# Patient Record
Sex: Female | Born: 1980 | Race: White | Hispanic: No | Marital: Married | Smoking: Never smoker
Health system: Southern US, Community
[De-identification: ages and names within clinical notes are randomized; demographics above are authoritative.]

## PROBLEM LIST (undated history)

## (undated) DIAGNOSIS — R51 Headache: Secondary | ICD-10-CM

## (undated) DIAGNOSIS — O1423 HELLP syndrome (HELLP), third trimester: Secondary | ICD-10-CM

## (undated) DIAGNOSIS — K219 Gastro-esophageal reflux disease without esophagitis: Secondary | ICD-10-CM

## (undated) DIAGNOSIS — R519 Headache, unspecified: Secondary | ICD-10-CM

## (undated) DIAGNOSIS — I10 Essential (primary) hypertension: Secondary | ICD-10-CM

## (undated) HISTORY — PX: WISDOM TOOTH EXTRACTION: SHX21

---

## 1999-05-12 ENCOUNTER — Emergency Department (HOSPITAL_COMMUNITY): Admission: EM | Admit: 1999-05-12 | Discharge: 1999-05-12 | Payer: Self-pay | Admitting: Emergency Medicine

## 2003-02-22 ENCOUNTER — Other Ambulatory Visit: Admission: RE | Admit: 2003-02-22 | Discharge: 2003-02-22 | Payer: Self-pay | Admitting: Obstetrics and Gynecology

## 2004-04-06 ENCOUNTER — Other Ambulatory Visit: Admission: RE | Admit: 2004-04-06 | Discharge: 2004-04-06 | Payer: Self-pay | Admitting: Obstetrics and Gynecology

## 2004-06-30 ENCOUNTER — Inpatient Hospital Stay (HOSPITAL_COMMUNITY): Admission: AD | Admit: 2004-06-30 | Discharge: 2004-06-30 | Payer: Self-pay | Admitting: Obstetrics and Gynecology

## 2004-09-02 ENCOUNTER — Inpatient Hospital Stay (HOSPITAL_COMMUNITY): Admission: AD | Admit: 2004-09-02 | Discharge: 2004-09-07 | Payer: Self-pay | Admitting: Obstetrics and Gynecology

## 2004-09-02 ENCOUNTER — Encounter (INDEPENDENT_AMBULATORY_CARE_PROVIDER_SITE_OTHER): Payer: Self-pay | Admitting: Specialist

## 2004-09-08 ENCOUNTER — Encounter: Admission: RE | Admit: 2004-09-08 | Discharge: 2004-10-08 | Payer: Self-pay | Admitting: Obstetrics and Gynecology

## 2004-10-09 ENCOUNTER — Encounter: Admission: RE | Admit: 2004-10-09 | Discharge: 2004-11-08 | Payer: Self-pay | Admitting: Obstetrics and Gynecology

## 2004-10-19 ENCOUNTER — Other Ambulatory Visit: Admission: RE | Admit: 2004-10-19 | Discharge: 2004-10-19 | Payer: Self-pay | Admitting: Obstetrics and Gynecology

## 2004-10-24 ENCOUNTER — Ambulatory Visit: Payer: Self-pay | Admitting: Oncology

## 2004-12-14 ENCOUNTER — Ambulatory Visit: Payer: Self-pay | Admitting: Oncology

## 2005-03-20 ENCOUNTER — Ambulatory Visit: Payer: Self-pay | Admitting: Oncology

## 2009-01-03 ENCOUNTER — Emergency Department (HOSPITAL_BASED_OUTPATIENT_CLINIC_OR_DEPARTMENT_OTHER): Admission: EM | Admit: 2009-01-03 | Discharge: 2009-01-03 | Payer: Self-pay | Admitting: Emergency Medicine

## 2010-08-04 LAB — CBC
HCT: 42.1 % (ref 36.0–46.0)
MCHC: 33.4 g/dL (ref 30.0–36.0)
MCV: 91.6 fL (ref 78.0–100.0)
RDW: 11.4 % — ABNORMAL LOW (ref 11.5–15.5)
WBC: 5.3 10*3/uL (ref 4.0–10.5)

## 2010-08-04 LAB — DIFFERENTIAL
Basophils Relative: 0 % (ref 0–1)
Eosinophils Absolute: 0 10*3/uL (ref 0.0–0.7)
Lymphocytes Relative: 27 % (ref 12–46)
Lymphs Abs: 1.4 10*3/uL (ref 0.7–4.0)
Monocytes Absolute: 0.5 10*3/uL (ref 0.1–1.0)
Neutro Abs: 3.4 10*3/uL (ref 1.7–7.7)

## 2010-08-04 LAB — URINALYSIS, ROUTINE W REFLEX MICROSCOPIC
Bilirubin Urine: NEGATIVE
Hgb urine dipstick: NEGATIVE
Ketones, ur: 15 mg/dL — AB
Nitrite: NEGATIVE
Specific Gravity, Urine: 1.006 (ref 1.005–1.030)
Urobilinogen, UA: 0.2 mg/dL (ref 0.0–1.0)

## 2010-08-04 LAB — PREGNANCY, URINE: Preg Test, Ur: NEGATIVE

## 2010-08-04 LAB — COMPREHENSIVE METABOLIC PANEL
Creatinine, Ser: 0.7 mg/dL (ref 0.4–1.2)
GFR calc non Af Amer: >60 mL/min (ref >60)
Glucose, Bld: 93 mg/dL (ref 70–99)
Total Bilirubin: 0.5 mg/dL (ref 0.3–1.2)

## 2010-09-15 NOTE — Op Note (Signed)
[name], [name]              ACCOUNT NO.:  [id number]   MEDICAL RECORD NO.:  [id number]          PATIENT TYPE:  INP   LOCATION:  9198                          FACILITY:  [facility]   PHYSICIAN:  Juluis Mire, M.D.   DATE OF BIRTH:  Feb 20, 1981   DATE OF PROCEDURE:  09/02/2004  DATE OF DISCHARGE:                                 OPERATIVE REPORT   PREOPERATIVE DIAGNOSES:  1.  Intrauterine pregnancy at 32 weeks.  2.  Severe pregnancy-induced hypertension.   POSTOPERATIVE DIAGNOSES:  1.  Intrauterine pregnancy at 32 weeks.  2.  Severe pregnancy-induced hypertension.   OPERATION:  Primary low transverse cesarean section.   SURGEON:  Juluis Mire, M.D.   ANESTHESIA:  General.   ESTIMATED BLOOD LOSS:  400 mL.   PACKS AND DRAINS:  None.   INTRAOPERATIVE BLOOD REPLACEMENT:  None.   COMPLICATIONS:  None.   INDICATIONS:  Dictated in History and Physical.   PROCEDURE IN DETAIL:  The patient was taken to the OR and placed in supine  position with left lateral tilt.  The abdomen was prepped with Betadine,  draped as a sterile field.  After satisfactory level of general endotracheal  anesthesia obtained, a low-transverse skin incision was made with the knife  and carried through to the subcutaneous tissue.  The fascia was entered  sharply and incision to the fascia extended laterally.  The fascia was taken  off the muscles superiorly and inferiorly.  The rectus muscles were  separated in the midline.  The peritoneum was entered and the incision in  the peritoneum extended both superiorly and inferiorly.  A low-transverse  bladder flap was developed.  The low transverse uterine incision begun with  the knife and extended using manual traction.  Amniotic fluid was clear.  The infant was delivered with elevation of head and fundal pressure.  There  was a nuchal cord x2.  The infant was a viable female.  Apgars were 5/8.  Umbilical pH was 7.32.  The placenta was then delivered manually  and sent  for pathologic review.  The uterus was exteriorized for closure.  Tubes and  ovaries were unremarkable.  The uterus was closed with a running locking  suture of 0 Chromic using two-layer closure technique.  We got good  hemostasis and clear urine output.  The pelvic cavity was thoroughly  irrigated.  Hemostasis again excellent.  Uterus turned back to the abdominal  cavity.  Muscles and peritoneum closed with running suture of 3-0 Vicryl.  The fascia closed with running suture of 0 PDS.  Subcutaneous closed with  running suture of 3-0 Vicryl.  The skin was closed  with staples and Steri-Strips.  Sponge, instrument, needle count reported as  correct by circulating nurse x2.  Foley catheter remained clear at the time  of closure.  The patient tolerated the procedure well and was returned to  recovery room in good condition.      JSM/MEDQ  D:  09/02/2004  T:  09/02/2004  Job:  [id number]

## 2010-09-15 NOTE — H&P (Signed)
NAMEZAHNIYA, [name] NO.:  [id number]   MEDICAL RECORD NO.:  [id number]           PATIENT TYPE:   LOCATION:                               FACILITY:  [facility]   PHYSICIAN:  Juluis Mire, M.D.   DATE OF BIRTH:  04/10/1948   DATE OF ADMISSION:  09/02/2004  DATE OF DISCHARGE:                                HISTORY & PHYSICAL   HISTORY OF PRESENT ILLNESS:  The patient is a 30 year old prima gravida  married white female, last menstrual period of January 25, 2004, giving  her an estimated date of confinement October 31, 2004, given her an estimated  gestational age of 31-1/2 weeks, consistent with the initial evaluation  ultrasound.   Her prenatal course had been complicated by some issues with preterm uterine  activity.  She presented to the triage area complaining of increasing back,  shoulder discomfort and abdominal pain.  Initial monitoring did reveal  uterine activity that resolved with Terbutaline.  Blood pressure was 132/90  on initial evaluation.  Other vital signs were stable.  She complained of  significant upper back pain, radiating to the right arm and the upper  abdomen.  An arterial blood gas was obtained to rule out a pulmonary  embolus. This was negative.  Subsequently, PIH panel was obtained which  revealed marked abnormalities.  Her LDH was 263.  Her SGOT was 107, SGPT was  119 and her platelet count was 74,000.  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  It was noted that her cervix was long and closed and in view of  the laboratories, we are going to proceed with primary cesarean section.   ALLERGIES:  In terms of allergies, NO KNOWN DRUG ALLERGIES.   MEDICATIONS:  Prenatal vitamins.   PAST MEDICAL HISTORY:  For past medical history, family history, and social  history, please see prenatal records.   REVIEW OF SYSTEMS:  Noncontributory except for the above-noted  complaints.   PHYSICAL EXAMINATION:  VITAL SIGNS:  The patient's blood pressure is 132/90.  Other vital signs are stable.  LUNGS:  Clear.  CARDIAC:  A regular rate and rhythm with grade 2/6 systolic ejection murmur.  No clicks or gallops.  ABDOMEN:  Gravid uterus consistent with dates.  There is some epigastric  tenderness.  Bowel sounds are active.  Estimated fetal weight would probably  be approximately a 4 to 4.5 pounds  She does have marked upper back pain.  No tenderness or CVA tenderness is noted.  PELVIC:  Cervix is extremely long and closed.  Deep tendon reflexes revealed  3+ deep tendon reflexes with no clonus.  She does have 1+ pitting edema.   A biophysical profile had been obtained.  We had a value of 6/8 with no  respirations.  Amniotic fluid was normal.  Cervical length was 4.2 cm.  It  is also noted that we evaluated the renal status which was unremarkable in  terms of the patient.   Fetal heart rate tracing is reactive with  no decelerations and again,  laboratories revealed a platelet count of 74,000, hemoglobin was 12.1.  Her  SGOT was 207, SGPT was 119, LDH was 363 and her uric acid was 5.   IMPRESSION:  1. Intra-uterine pregnancy at 31-1/2 weeks with severe toxemia in the form      of HELP syndrome.  2. Back discomfort probably secondary to liver congestion.     PLAN:  The patient will undergo primary cesarean section.  The risks have  been discussed including the risk of anesthesia.  The risk of infection.  The risk of hemorrhage that could require transfusion with the risk of AIDS  or hepatitis.  The risk of injury to adjacent organs including bladder,  bowel or ureters that could require further exploratory surgery.  The risk  of deep venous thrombosis and pulmonary embolus.  In view of low platelets,  there is a risk of incisional ecchymosis and other bleeding complications,  need for platelet transfusions have been discussed.  We have discussed the   nature of the HELP syndrome and its implications.  We have discussed the  need for early delivery.  The risk of prematurity of the infant in terms of  immature lungs have been discussed, its management in the intensive care  nursery have also been explained.  Finally, we have explained postoperative  management for the patient that will include magnesium sulfate and  monitoring in the adult intensive care unit.  There may be the again need  for platelet transfusions or other management if worsening toxemia.      JSM/MEDQ  D:  09/02/2004  T:  09/02/2004  Job:  [id number]

## 2010-09-15 NOTE — Discharge Summary (Signed)
NAMECYNITHIA, [name]              ACCOUNT NO.:  [id number]   MEDICAL RECORD NO.:  [id number]          PATIENT TYPE:  INP   LOCATION:  9310                          FACILITY:  [facility]   PHYSICIAN:  Michelle L. Grewal, M.D.DATE OF BIRTH:  06/02/1980   DATE OF ADMISSION:  09/02/2004  DATE OF DISCHARGE:  09/07/2004                                 DISCHARGE SUMMARY   ADMITTING DIAGNOSES:  1.  Intrauterine pregnancy at 31 weeks estimated gestational age.  2.  Severe pregnancy-induced hypertension with HELLP syndrome.   DISCHARGE DIAGNOSES:  1.  Status post low transverse cesarean section.  2.  Viable female infant.   PROCEDURE:  Primary low transverse cesarean section.   REASON FOR ADMISSION:  Please see dictated H&P.   HOSPITAL COURSE:  The patient is a 30 year old, white married female,  primigravida who was [facility] to [facility] at 31 weeks  estimated gestational age. The patient's prenatal course had been  complicated by some preterm uterine activity. The patient had presented to  triage area complaining of back and shoulder discomfort with abdominal pain.  Initial fetal monitoring did reveal some uterine contractions which did  resolve initially with terbutaline. Blood pressure on admission was 132/90.  Other vital signs were stable. The patient complained of significant pain  which radiated into her right arm and upper abdomen. Arterial cord blood gas  was obtained to rule out a pulmonary embolus. Results were negative.  Subsequently a PIH panel was obtained which revealed marked abnormalities.  LDH was 263, SGOT was 107, SGPT was 119 and platelet count was 74,000. The  diagnosis of severe toxemia was made and a decision was made to proceed with  cesarean delivery. Biophysical profile also had been obtained with a value  of 6/8 without respiration. Amniotic fluid index was normal. Cervical length  was noted to be 4.2 cm. In view of the laboratory findings, a  decision was  made to proceed with a primary low transverse cesarean section. The patient  was then taken to the operating room where spinal anesthesia was  administered without difficulty. A low transverse incision was made with  delivery of a viable female infant weighing 3 pounds 11 ounces with Apgar's of  5 at 1 minute and  8 at 5 minutes. Arterial cord pH was 7.32. The patient  tolerated the procedure well and was taken to the recovery room in stable  condition. The patient was then transferred to the AICU where she was placed  on magnesium sulfate. On the following morning, the patient was without  complaint. Vital signs were stable with blood pressure 120/80, abdomen soft  with good return of bowel function. Fundus was firm and nontender. The  patient had good urine output. Liver function tests were improving, platelet  count was 62,000. The patient continued on magnesium sulfate. On  postoperative day #2, the patient was without headache, blurred vision or  right upper quadrant pain. Vital signs remained stable. She was afebrile.  Blood pressure was 130-140's/70- 80's. Deep tendon reflexes were 2+ without  clonus. Abdomen was soft, fundus was firm and nontender.  Abdominal dressing  had been removed revealing an incision that is clean, dry, and intact. Urine  output was noted to be 100-150 mL/hour; however, she was still in the  deficit of intake over the last 24 hours of 1720 and output 1030.   Laboratory findings revealed s hemoglobin of 9.6, platelet count was 83,000  which was up from 62,000 on the previous day. AST was 30, ALT was 71 and LDH  was 267. Magnesium level was 6.2.  The patient was given Lasix 20 mg IV and  PIH labs were drawn later in the evening. On the following day, the patient  was without complaint, magnesium had been discontinued on the previous  evening. Vital signs were stable, blood pressure was 122/88 with good urine  output.   Laboratory findings revealed  hemoglobin of 10.4, platelet count was up to  101,000. SGOT was 35, SGPT was 65. The patient was thought to be improving  and was later transferred to the mother baby unit that evening. On  postoperative day #4, the patient did complain of a headache, blood pressure  was 119/75, other vital signs were stable. Abdomen soft, fundus was firm and  nontender. Incision was clean, dry and intact. PIH labs revealed an AST of  45 and ALT of 63. The decision was to observe the patient an additional day.  On postoperative day #5, the patient was without complaint, headache and  resolved, vital signs remained stable. She was afebrile. Blood pressure was  normalized, abdomen soft. Incision was clean, dry and intact. Staples  removed and the patient was now discharge home.   CONDITION ON DISCHARGE:  Stable.   DIET:  Regular as tolerated.   ACTIVITY:  No heavy lifting, no driving x2 weeks, no vaginal entry.   FOLLOW-UP:  Patient to follow up in the office in one week for an incision  check. She is to call for a temperature greater than 100 degrees, persistent  nausea and vomiting, heavy vaginal bleeding and/or redness or drainage from  the incisional site. The patient was also instructed to call for headache,  blurred vision or right upper quadrant pain or jitteriness.   DISCHARGE MEDICATIONS:  1.  Tylox #30, 1 p.o. every 4-6h p.r.n.  2.  Motrin 600 mg every 6 hours.  3.  Prenatal vitamins one p.o. daily.  4.  Colace one p.o. daily p.r.n.      CC/MEDQ  D:  09/27/2004  T:  09/27/2004  Job:  [id number]

## 2012-04-08 ENCOUNTER — Other Ambulatory Visit: Payer: Self-pay | Admitting: Obstetrics and Gynecology

## 2014-08-18 ENCOUNTER — Other Ambulatory Visit: Payer: Self-pay | Admitting: Obstetrics and Gynecology

## 2014-08-23 LAB — CYTOLOGY - PAP

## 2014-11-18 LAB — OB RESULTS CONSOLE RUBELLA ANTIBODY, IGM: Rubella: NON-IMMUNE/NOT IMMUNE

## 2015-06-07 ENCOUNTER — Encounter (HOSPITAL_COMMUNITY): Payer: Self-pay | Admitting: *Deleted

## 2015-06-07 ENCOUNTER — Encounter (HOSPITAL_COMMUNITY): Payer: Self-pay

## 2015-06-07 ENCOUNTER — Inpatient Hospital Stay (HOSPITAL_COMMUNITY)
Admission: AD | Admit: 2015-06-07 | Discharge: 2015-06-07 | Disposition: A | Discharge status: Home / Self Care | Payer: BLUE CROSS/BLUE SHIELD | Source: Ambulatory Visit | Attending: Obstetrics & Gynecology | Admitting: Obstetrics & Gynecology

## 2015-06-07 ENCOUNTER — Inpatient Hospital Stay (HOSPITAL_COMMUNITY)
Admission: AD | Admit: 2015-06-07 | Discharge: 2015-06-10 | DRG: 765 | Disposition: A | Discharge status: Home / Self Care | Payer: BLUE CROSS/BLUE SHIELD | Source: Ambulatory Visit | Attending: Obstetrics and Gynecology | Admitting: Obstetrics and Gynecology

## 2015-06-07 DIAGNOSIS — Z98891 History of uterine scar from previous surgery: Secondary | ICD-10-CM

## 2015-06-07 DIAGNOSIS — O34211 Maternal care for low transverse scar from previous cesarean delivery: Principal | ICD-10-CM | POA: Diagnosis present

## 2015-06-07 DIAGNOSIS — O9962 Diseases of the digestive system complicating childbirth: Secondary | ICD-10-CM | POA: Diagnosis present

## 2015-06-07 DIAGNOSIS — Z3A37 37 weeks gestation of pregnancy: Secondary | ICD-10-CM

## 2015-06-07 DIAGNOSIS — K219 Gastro-esophageal reflux disease without esophagitis: Secondary | ICD-10-CM | POA: Diagnosis present

## 2015-06-07 DIAGNOSIS — Z302 Encounter for sterilization: Secondary | ICD-10-CM

## 2015-06-07 DIAGNOSIS — O1092 Unspecified pre-existing hypertension complicating childbirth: Secondary | ICD-10-CM | POA: Diagnosis present

## 2015-06-07 DIAGNOSIS — O4292 Full-term premature rupture of membranes, unspecified as to length of time between rupture and onset of labor: Secondary | ICD-10-CM | POA: Diagnosis present

## 2015-06-07 HISTORY — DX: Gastro-esophageal reflux disease without esophagitis: K21.9

## 2015-06-07 HISTORY — DX: Headache: R51

## 2015-06-07 HISTORY — DX: HELLP syndrome (HELLP), third trimester: O14.23

## 2015-06-07 HISTORY — DX: Headache, unspecified: R51.9

## 2015-06-07 HISTORY — DX: Essential (primary) hypertension: I10

## 2015-06-07 LAB — POCT FERN TEST: POCT FERN TEST: POSITIVE

## 2015-06-07 NOTE — Discharge Instructions (Signed)
Braxton Hicks Contractions °Contractions of the uterus can occur throughout pregnancy. Contractions are not always a sign that you are in labor.  °WHAT ARE BRAXTON HICKS CONTRACTIONS?  °Contractions that occur before labor are called Braxton Hicks contractions, or false labor. Toward the end of pregnancy (32-34 weeks), these contractions can develop more often and may become more forceful. This is not true labor because these contractions do not result in opening (dilatation) and thinning of the cervix. They are sometimes difficult to tell apart from true labor because these contractions can be forceful and people have different pain tolerances. You should not feel embarrassed if you go to the hospital with false labor. Sometimes, the only way to tell if you are in true labor is for your health care provider to look for changes in the cervix. °If there are no prenatal problems or other health problems associated with the pregnancy, it is completely safe to be sent home with false labor and await the onset of true labor. °HOW CAN YOU TELL THE DIFFERENCE BETWEEN TRUE AND FALSE LABOR? °False Labor °· The contractions of false labor are usually shorter and not as hard as those of true labor.   °· The contractions are usually irregular.   °· The contractions are often felt in the front of the lower abdomen and in the groin.   °· The contractions may go away when you walk around or change positions while lying down.   °· The contractions get weaker and are shorter lasting as time goes on.   °· The contractions do not usually become progressively stronger, regular, and closer together as with true labor.   °True Labor °· Contractions in true labor last 30-70 seconds, become very regular, usually become more intense, and increase in frequency.   °· The contractions do not go away with walking.   °· The discomfort is usually felt in the top of the uterus and spreads to the lower abdomen and low back.   °· True labor can be  determined by your health care provider with an exam. This will show that the cervix is dilating and getting thinner.   °WHAT TO REMEMBER °· Keep up with your usual exercises and follow other instructions given by your health care provider.   °· Take medicines as directed by your health care provider.   °· Keep your regular prenatal appointments.   °· Eat and drink lightly if you think you are going into labor.   °· If Braxton Hicks contractions are making you uncomfortable:   °¨ Change your position from lying down or resting to walking, or from walking to resting.   °¨ Sit and rest in a tub of warm water.   °¨ Drink 2-3 glasses of water. Dehydration may cause these contractions.   °¨ Do slow and deep breathing several times an hour.   °WHEN SHOULD I SEEK IMMEDIATE MEDICAL CARE? °Seek immediate medical care if: °· Your contractions become stronger, more regular, and closer together.   °· You have fluid leaking or gushing from your vagina.   °· You have a fever.   °· You pass blood-tinged mucus.   °· You have vaginal bleeding.   °· You have continuous abdominal pain.   °· You have low back pain that you never had before.   °· You feel your baby's head pushing down and causing pelvic pressure.   °· Your baby is not moving as much as it used to.   °  °This information is not intended to replace advice given to you by your health care provider. Make sure you discuss any questions you have with your health care   provider. °  °Document Released: 04/16/2005 Document Revised: 04/21/2013 Document Reviewed: 01/26/2013 °Elsevier Interactive Patient Education ©2016 [employer] ° °

## 2015-06-07 NOTE — MAU Note (Signed)
Pt c/o contractions every 5-10 mins all day. Denies LOF or vag bleeding. +FM Cervix was closed Friday.

## 2015-06-08 ENCOUNTER — Inpatient Hospital Stay (HOSPITAL_COMMUNITY): Payer: BLUE CROSS/BLUE SHIELD | Admitting: Anesthesiology

## 2015-06-08 ENCOUNTER — Encounter (HOSPITAL_COMMUNITY)
Admission: AD | Disposition: A | Discharge status: Home / Self Care | Payer: Self-pay | Source: Ambulatory Visit | Attending: Obstetrics and Gynecology

## 2015-06-08 ENCOUNTER — Encounter (HOSPITAL_COMMUNITY): Payer: Self-pay | Admitting: Anesthesiology

## 2015-06-08 DIAGNOSIS — O9962 Diseases of the digestive system complicating childbirth: Secondary | ICD-10-CM | POA: Diagnosis present

## 2015-06-08 DIAGNOSIS — O34211 Maternal care for low transverse scar from previous cesarean delivery: Secondary | ICD-10-CM | POA: Diagnosis present

## 2015-06-08 DIAGNOSIS — Z98891 History of uterine scar from previous surgery: Secondary | ICD-10-CM

## 2015-06-08 DIAGNOSIS — O4292 Full-term premature rupture of membranes, unspecified as to length of time between rupture and onset of labor: Secondary | ICD-10-CM | POA: Diagnosis present

## 2015-06-08 DIAGNOSIS — K219 Gastro-esophageal reflux disease without esophagitis: Secondary | ICD-10-CM | POA: Diagnosis present

## 2015-06-08 DIAGNOSIS — Z3A37 37 weeks gestation of pregnancy: Secondary | ICD-10-CM | POA: Diagnosis not present

## 2015-06-08 DIAGNOSIS — O1092 Unspecified pre-existing hypertension complicating childbirth: Secondary | ICD-10-CM | POA: Diagnosis present

## 2015-06-08 DIAGNOSIS — Z302 Encounter for sterilization: Secondary | ICD-10-CM | POA: Diagnosis not present

## 2015-06-08 LAB — CBC
HEMATOCRIT: 31.2 % — AB (ref 36.0–46.0)
HEMATOCRIT: 31.6 % — AB (ref 36.0–46.0)
HEMOGLOBIN: 10.5 g/dL — AB (ref 12.0–15.0)
HEMOGLOBIN: 10.9 g/dL — AB (ref 12.0–15.0)
MCH: 30.2 pg (ref 26.0–34.0)
MCH: 30.8 pg (ref 26.0–34.0)
MCHC: 33.7 g/dL (ref 30.0–36.0)
MCHC: 34.5 g/dL (ref 30.0–36.0)
MCV: 89.3 fL (ref 78.0–100.0)
MCV: 89.7 fL (ref 78.0–100.0)
Platelets: 103 10*3/uL — ABNORMAL LOW (ref 150–400)
Platelets: 93 10*3/uL — ABNORMAL LOW (ref 150–400)
RBC: 3.48 MIL/uL — AB (ref 3.87–5.11)
RBC: 3.54 MIL/uL — ABNORMAL LOW (ref 3.87–5.11)
RDW: 13.8 % (ref 11.5–15.5)
RDW: 13.8 % (ref 11.5–15.5)
WBC: 10.3 10*3/uL (ref 4.0–10.5)
WBC: 11.1 10*3/uL — AB (ref 4.0–10.5)

## 2015-06-08 LAB — COMPREHENSIVE METABOLIC PANEL
ALBUMIN: 2.7 g/dL — AB (ref 3.5–5.0)
ALT: 16 U/L (ref 14–54)
AST: 22 U/L (ref 15–41)
Alkaline Phosphatase: 83 U/L (ref 38–126)
Anion gap: 9 (ref 5–15)
BILIRUBIN TOTAL: 0.4 mg/dL (ref 0.3–1.2)
BUN: 8 mg/dL (ref 6–20)
CO2: 19 mmol/L — ABNORMAL LOW (ref 22–32)
CREATININE: 0.69 mg/dL (ref 0.44–1.00)
Calcium: 8.7 mg/dL — ABNORMAL LOW (ref 8.9–10.3)
Chloride: 109 mmol/L (ref 101–111)
GFR calc Af Amer: >60 mL/min (ref >60)
GLUCOSE: 91 mg/dL (ref 65–99)
Potassium: 3.6 mmol/L (ref 3.5–5.1)
Sodium: 137 mmol/L (ref 135–145)
TOTAL PROTEIN: 6.3 g/dL — AB (ref 6.5–8.1)

## 2015-06-08 LAB — ABO/RH: ABO/RH(D): A POS

## 2015-06-08 LAB — TYPE AND SCREEN
ABO/RH(D): A POS
ANTIBODY SCREEN: NEGATIVE

## 2015-06-08 LAB — URIC ACID: URIC ACID, SERUM: 4 mg/dL (ref 2.3–6.6)

## 2015-06-08 LAB — RPR: RPR Ser Ql: NONREACTIVE

## 2015-06-08 SURGERY — Surgical Case
Anesthesia: Spinal

## 2015-06-08 MED ORDER — ONDANSETRON HCL 4 MG/2ML IJ SOLN
INTRAMUSCULAR | Status: DC | PRN
  Administered 2015-06-08: 4 mg via INTRAVENOUS

## 2015-06-08 MED ORDER — OXYTOCIN 10 UNIT/ML IJ SOLN
INTRAMUSCULAR | Status: AC
  Filled 2015-06-08: qty 4

## 2015-06-08 MED ORDER — FAMOTIDINE IN NACL 20-0.9 MG/50ML-% IV SOLN: 20.0000 mg | Freq: Once | INTRAVENOUS | Status: DC

## 2015-06-08 MED ORDER — LACTATED RINGERS IV SOLN
INTRAVENOUS | Status: DC | PRN
  Administered 2015-06-08 (×2): via INTRAVENOUS

## 2015-06-08 MED ORDER — ONDANSETRON HCL 4 MG/2ML IJ SOLN
INTRAMUSCULAR | Status: AC
  Filled 2015-06-08: qty 2

## 2015-06-08 MED ORDER — PROMETHAZINE HCL 25 MG/ML IJ SOLN
12.5000 mg | INTRAMUSCULAR | Status: DC | PRN
  Administered 2015-06-08: 12.5 mg via INTRAVENOUS
  Filled 2015-06-08: qty 1

## 2015-06-08 MED ORDER — CITRIC ACID-SODIUM CITRATE 334-500 MG/5ML PO SOLN
30.0000 mL | Freq: Once | ORAL | Status: AC
  Administered 2015-06-08: 30 mL via ORAL
  Filled 2015-06-08: qty 15

## 2015-06-08 MED ORDER — FENTANYL CITRATE (PF) 100 MCG/2ML IJ SOLN
INTRAMUSCULAR | Status: DC | PRN
  Administered 2015-06-08: 20 ug via INTRATHECAL

## 2015-06-08 MED ORDER — DEXAMETHASONE SODIUM PHOSPHATE 10 MG/ML IJ SOLN
INTRAMUSCULAR | Status: DC | PRN
  Administered 2015-06-08: 10 mg via INTRAVENOUS

## 2015-06-08 MED ORDER — WITCH HAZEL-GLYCERIN EX PADS: 1.0000 "application " | MEDICATED_PAD | CUTANEOUS | Status: DC | PRN

## 2015-06-08 MED ORDER — NALOXONE HCL 2 MG/2ML IJ SOSY: 1.0000 ug/kg/h | PREFILLED_SYRINGE | INTRAVENOUS | Status: DC | PRN

## 2015-06-08 MED ORDER — BUPIVACAINE IN DEXTROSE 0.75-8.25 % IT SOLN
INTRATHECAL | Status: DC | PRN
  Administered 2015-06-08: 11 mg via INTRATHECAL

## 2015-06-08 MED ORDER — FENTANYL CITRATE (PF) 100 MCG/2ML IJ SOLN: 25.0000 ug | INTRAMUSCULAR | Status: DC | PRN

## 2015-06-08 MED ORDER — SIMETHICONE 80 MG PO CHEW
80.0000 mg | CHEWABLE_TABLET | ORAL | Status: DC | PRN
  Filled 2015-06-08: qty 1

## 2015-06-08 MED ORDER — DIPHENHYDRAMINE HCL 50 MG/ML IJ SOLN: 12.5000 mg | INTRAMUSCULAR | Status: DC | PRN

## 2015-06-08 MED ORDER — ZOLPIDEM TARTRATE 5 MG PO TABS: 5.0000 mg | ORAL_TABLET | Freq: Every evening | ORAL | Status: DC | PRN

## 2015-06-08 MED ORDER — SCOPOLAMINE 1 MG/3DAYS TD PT72: 1.0000 | MEDICATED_PATCH | Freq: Once | TRANSDERMAL | Status: DC

## 2015-06-08 MED ORDER — LACTATED RINGERS IV SOLN
INTRAVENOUS | Status: DC | PRN
  Administered 2015-06-08: 07:00:00 via INTRAVENOUS

## 2015-06-08 MED ORDER — PRENATAL MULTIVITAMIN CH
1.0000 | ORAL_TABLET | Freq: Every day | ORAL | Status: DC
  Administered 2015-06-09: 1 via ORAL
  Filled 2015-06-08 (×2): qty 1

## 2015-06-08 MED ORDER — NALOXONE HCL 0.4 MG/ML IJ SOLN: 0.4000 mg | INTRAMUSCULAR | Status: DC | PRN

## 2015-06-08 MED ORDER — NALBUPHINE HCL 10 MG/ML IJ SOLN: 5.0000 mg | INTRAMUSCULAR | Status: DC | PRN

## 2015-06-08 MED ORDER — OXYCODONE-ACETAMINOPHEN 5-325 MG PO TABS
1.0000 | ORAL_TABLET | ORAL | Status: DC | PRN
  Administered 2015-06-09 – 2015-06-10 (×3): 1 via ORAL
  Filled 2015-06-08 (×3): qty 1

## 2015-06-08 MED ORDER — SCOPOLAMINE 1 MG/3DAYS TD PT72
1.0000 | MEDICATED_PATCH | TRANSDERMAL | Status: DC
  Filled 2015-06-08 (×2): qty 1

## 2015-06-08 MED ORDER — SIMETHICONE 80 MG PO CHEW
80.0000 mg | CHEWABLE_TABLET | Freq: Three times a day (TID) | ORAL | Status: DC
  Administered 2015-06-08 – 2015-06-10 (×5): 80 mg via ORAL
  Filled 2015-06-08 (×11): qty 1

## 2015-06-08 MED ORDER — MENTHOL 3 MG MT LOZG: 1.0000 | LOZENGE | OROMUCOSAL | Status: DC | PRN

## 2015-06-08 MED ORDER — DIBUCAINE 1 % RE OINT
1.0000 "application " | TOPICAL_OINTMENT | RECTAL | Status: DC | PRN
  Filled 2015-06-08: qty 28

## 2015-06-08 MED ORDER — NALBUPHINE HCL 10 MG/ML IJ SOLN
5.0000 mg | Freq: Once | INTRAMUSCULAR | Status: DC | PRN
Start: 2015-06-08 — End: 2015-06-10

## 2015-06-08 MED ORDER — ACETAMINOPHEN 10 MG/ML IV SOLN
1000.0000 mg | Freq: Four times a day (QID) | INTRAVENOUS | Status: AC
  Administered 2015-06-08: 1000 mg via INTRAVENOUS
  Filled 2015-06-08 (×4): qty 100

## 2015-06-08 MED ORDER — MORPHINE SULFATE (PF) 0.5 MG/ML IJ SOLN
INTRAMUSCULAR | Status: AC
  Filled 2015-06-08: qty 10

## 2015-06-08 MED ORDER — ACETAMINOPHEN 325 MG PO TABS
650.0000 mg | ORAL_TABLET | ORAL | Status: DC | PRN
  Administered 2015-06-08: 650 mg via ORAL
  Filled 2015-06-08: qty 2

## 2015-06-08 MED ORDER — SIMETHICONE 80 MG PO CHEW
80.0000 mg | CHEWABLE_TABLET | ORAL | Status: DC
  Administered 2015-06-08 – 2015-06-09 (×2): 80 mg via ORAL
  Filled 2015-06-08 (×4): qty 1

## 2015-06-08 MED ORDER — 0.9 % SODIUM CHLORIDE (POUR BTL) OPTIME
TOPICAL | Status: DC | PRN
  Administered 2015-06-08: 1000 mL

## 2015-06-08 MED ORDER — LACTATED RINGERS IV SOLN
INTRAVENOUS | Status: DC
  Administered 2015-06-08: via INTRAVENOUS

## 2015-06-08 MED ORDER — SODIUM CHLORIDE 0.9% FLUSH: 3.0000 mL | INTRAVENOUS | Status: DC | PRN

## 2015-06-08 MED ORDER — LANOLIN HYDROUS EX OINT: 1.0000 "application " | TOPICAL_OINTMENT | CUTANEOUS | Status: DC | PRN

## 2015-06-08 MED ORDER — CEFAZOLIN SODIUM-DEXTROSE 2-3 GM-% IV SOLR
INTRAVENOUS | Status: DC | PRN
  Administered 2015-06-08: 2 g via INTRAVENOUS

## 2015-06-08 MED ORDER — FAMOTIDINE IN NACL 20-0.9 MG/50ML-% IV SOLN
INTRAVENOUS | Status: AC
  Administered 2015-06-08: 02:00:00
  Filled 2015-06-08: qty 50

## 2015-06-08 MED ORDER — ONDANSETRON HCL 4 MG/2ML IJ SOLN: 4.0000 mg | Freq: Three times a day (TID) | INTRAMUSCULAR | Status: DC | PRN

## 2015-06-08 MED ORDER — SENNOSIDES-DOCUSATE SODIUM 8.6-50 MG PO TABS
2.0000 | ORAL_TABLET | ORAL | Status: DC
Start: 2015-06-09 — End: 2015-06-10
  Administered 2015-06-08: 2 via ORAL
  Filled 2015-06-08 (×4): qty 2

## 2015-06-08 MED ORDER — PHENYLEPHRINE 8 MG IN D5W 100 ML (0.08MG/ML) PREMIX OPTIME
INJECTION | INTRAVENOUS | Status: AC
  Filled 2015-06-08: qty 100

## 2015-06-08 MED ORDER — LABETALOL HCL 100 MG PO TABS
100.0000 mg | ORAL_TABLET | Freq: Two times a day (BID) | ORAL | Status: DC
  Administered 2015-06-08: 100 mg via ORAL
  Filled 2015-06-08 (×6): qty 1

## 2015-06-08 MED ORDER — OXYTOCIN 10 UNIT/ML IJ SOLN: 2.5000 [IU]/h | INTRAVENOUS | Status: AC

## 2015-06-08 MED ORDER — OXYCODONE-ACETAMINOPHEN 5-325 MG PO TABS
2.0000 | ORAL_TABLET | ORAL | Status: DC | PRN
  Administered 2015-06-09 – 2015-06-10 (×3): 2 via ORAL
  Filled 2015-06-08 (×3): qty 2

## 2015-06-08 MED ORDER — MEPERIDINE HCL 25 MG/ML IJ SOLN: 6.2500 mg | INTRAMUSCULAR | Status: DC | PRN

## 2015-06-08 MED ORDER — NALBUPHINE HCL 10 MG/ML IJ SOLN: 5.0000 mg | Freq: Once | INTRAMUSCULAR | Status: DC | PRN

## 2015-06-08 MED ORDER — FENTANYL CITRATE (PF) 100 MCG/2ML IJ SOLN
INTRAMUSCULAR | Status: AC
  Filled 2015-06-08: qty 2

## 2015-06-08 MED ORDER — MORPHINE SULFATE (PF) 0.5 MG/ML IJ SOLN
INTRAMUSCULAR | Status: DC | PRN
  Administered 2015-06-08: .2 mg via INTRATHECAL

## 2015-06-08 MED ORDER — DIPHENHYDRAMINE HCL 25 MG PO CAPS
25.0000 mg | ORAL_CAPSULE | ORAL | Status: DC | PRN
  Filled 2015-06-08: qty 1

## 2015-06-08 MED ORDER — DEXAMETHASONE SODIUM PHOSPHATE 10 MG/ML IJ SOLN
INTRAMUSCULAR | Status: AC
  Filled 2015-06-08: qty 1

## 2015-06-08 MED ORDER — DIPHENHYDRAMINE HCL 25 MG PO CAPS
25.0000 mg | ORAL_CAPSULE | Freq: Four times a day (QID) | ORAL | Status: DC | PRN
  Filled 2015-06-08: qty 1

## 2015-06-08 MED ORDER — LACTATED RINGERS IV SOLN
40.0000 [IU] | INTRAVENOUS | Status: DC | PRN
  Administered 2015-06-08: 40 [IU] via INTRAVENOUS

## 2015-06-08 MED ORDER — LACTATED RINGERS IV SOLN
INTRAVENOUS | Status: AC
Start: 2015-06-08 — End: 2015-06-08
  Filled 2015-06-08: qty 4

## 2015-06-08 MED ORDER — TETANUS-DIPHTH-ACELL PERTUSSIS 5-2.5-18.5 LF-MCG/0.5 IM SUSP: 0.5000 mL | Freq: Once | INTRAMUSCULAR | Status: DC

## 2015-06-08 SURGICAL SUPPLY — 38 items
APL SKNCLS STERI-STRIP NONHPOA (GAUZE/BANDAGES/DRESSINGS) ×1
BENZOIN TINCTURE PRP APPL 2/3 (GAUZE/BANDAGES/DRESSINGS) ×2 IMPLANT
CLAMP CORD UMBIL (MISCELLANEOUS) IMPLANT
CLOSURE WOUND 1/2 X4 (GAUZE/BANDAGES/DRESSINGS) ×1
CLOTH BEACON ORANGE TIMEOUT ST (SAFETY) ×3 IMPLANT
CONTAINER PREFILL 10% NBF 15ML (MISCELLANEOUS) IMPLANT
DRSG OPSITE POSTOP 4X10 (GAUZE/BANDAGES/DRESSINGS) ×3 IMPLANT
DURAPREP 26ML APPLICATOR (WOUND CARE) ×3 IMPLANT
ELECT REM PT RETURN 9FT ADLT (ELECTROSURGICAL) ×3
ELECTRODE REM PT RTRN 9FT ADLT (ELECTROSURGICAL) ×1 IMPLANT
EXTRACTOR VACUUM M CUP 4 TUBE (SUCTIONS) IMPLANT
EXTRACTOR VACUUM M CUP 4' TUBE (SUCTIONS)
GLOVE BIO SURGEON STRL SZ8 (GLOVE) ×3 IMPLANT
GLOVE BIOGEL PI IND STRL 7.0 (GLOVE) ×1 IMPLANT
GLOVE BIOGEL PI INDICATOR 7.0 (GLOVE) ×8
GOWN STRL REUS W/TWL LRG LVL3 (GOWN DISPOSABLE) ×8 IMPLANT
KIT ABG SYR 3ML LUER SLIP (SYRINGE) ×3 IMPLANT
LIQUID BAND (GAUZE/BANDAGES/DRESSINGS) IMPLANT
NDL HYPO 25X5/8 SAFETYGLIDE (NEEDLE) ×1 IMPLANT
NEEDLE HYPO 25X5/8 SAFETYGLIDE (NEEDLE) ×3 IMPLANT
NS IRRIG 1000ML POUR BTL (IV SOLUTION) ×3 IMPLANT
PACK C SECTION WH (CUSTOM PROCEDURE TRAY) ×3 IMPLANT
PAD ABD 8X7 1/2 STERILE (GAUZE/BANDAGES/DRESSINGS) ×2 IMPLANT
PAD OB MATERNITY 4.3X12.25 (PERSONAL CARE ITEMS) ×3 IMPLANT
PENCIL SMOKE EVAC W/HOLSTER (ELECTROSURGICAL) ×3 IMPLANT
SPONGE GAUZE 4X4 12PLY STER LF (GAUZE/BANDAGES/DRESSINGS) ×2 IMPLANT
STRIP CLOSURE SKIN 1/2X4 (GAUZE/BANDAGES/DRESSINGS) ×1 IMPLANT
SUT MNCRL 0 VIOLET CTX 36 (SUTURE) ×4 IMPLANT
SUT MONOCRYL 0 CTX 36 (SUTURE) ×8
SUT PDS AB 0 CTX 60 (SUTURE) ×5 IMPLANT
SUT PLAIN 0 NONE (SUTURE) IMPLANT
SUT PLAIN 2 0 (SUTURE)
SUT PLAIN 2 0 XLH (SUTURE) ×2 IMPLANT
SUT PLAIN ABS 2-0 CT1 27XMFL (SUTURE) IMPLANT
SUT VIC AB 4-0 KS 27 (SUTURE) ×3 IMPLANT
TAPE CLOTH SURG 4X10 WHT LF (GAUZE/BANDAGES/DRESSINGS) ×2 IMPLANT
TOWEL OR 17X24 6PK STRL BLUE (TOWEL DISPOSABLE) ×3 IMPLANT
TRAY FOLEY CATH SILVER 14FR (SET/KITS/TRAYS/PACK) ×3 IMPLANT

## 2015-06-08 NOTE — Anesthesia Preprocedure Evaluation (Addendum)
Anesthesia Evaluation  Patient identified by MRN, date of birth, ID band Patient awake    Reviewed: Allergy & Precautions, NPO status , Patient's Chart, lab work & pertinent test results, reviewed documented beta blocker date and time   History of Anesthesia Complications (+) PROLONGED EMERGENCE  Airway Mallampati: II  TM Distance: >3 FB Neck ROM: Full    Dental no notable dental hx. (+) Teeth Intact   Pulmonary neg pulmonary ROS,    Pulmonary exam normal breath sounds clear to auscultation       Cardiovascular hypertension, Pt. on medications and Pt. on home beta blockers Normal cardiovascular exam Rhythm:Regular Rate:Normal     Neuro/Psych  Headaches, negative psych ROS   GI/Hepatic Neg liver ROS, GERD  Medicated and Controlled,  Endo/Other  Obesity  Renal/GU negative Renal ROS  negative genitourinary   Musculoskeletal negative musculoskeletal ROS (+)   Abdominal (+) + obese,   Peds  Hematology  (+) anemia , Thrombocytopenia   Anesthesia Other Findings   Reproductive/Obstetrics (+) Pregnancy IUP 37 weeks Previous C/Section 31 weeks HELLP syndrome                            Anesthesia Physical Anesthesia Plan  ASA: II and emergent  Anesthesia Plan: Spinal   Post-op Pain Management:    Induction:   Airway Management Planned: Natural Airway  Additional Equipment:   Intra-op Plan:   Post-operative Plan:   Informed Consent: I have reviewed the patients History and Physical, chart, labs and discussed the procedure including the risks, benefits and alternatives for the proposed anesthesia with the patient or authorized representative who has indicated his/her understanding and acceptance.   Dental advisory given  Plan Discussed with: CRNA, Anesthesiologist and Surgeon  Anesthesia Plan Comments:         Anesthesia Quick Evaluation

## 2015-06-08 NOTE — Anesthesia Procedure Notes (Signed)
Spinal Patient location during procedure: OR Start time: 06/08/2015 6:15 AM Staffing Anesthesiologist: Mal Amabile Performed by: anesthesiologist  Preanesthetic Checklist Completed: patient identified, site marked, surgical consent, pre-op evaluation, timeout performed, IV checked, risks and benefits discussed and monitors and equipment checked Spinal Block Patient position: sitting Prep: site prepped and draped and DuraPrep Patient monitoring: heart rate, cardiac monitor, continuous pulse ox and blood pressure Approach: midline Location: L3-4 Injection technique: single-shot Needle Needle type: Sprotte  Needle gauge: 24 G Needle length: 9 cm Assessment Sensory level: T4 Additional Notes Patient tolerated procedure well. Adequate sensory level.

## 2015-06-08 NOTE — Op Note (Signed)
[name], [name]                 ACCOUNT NO.:  [id number]  MEDICAL RECORD NO.:  [id number]  LOCATION:  [facility]                          FACILITY:  [facility]  PHYSICIAN:  Guy Sandifer. Henderson Cloud, M.D. DATE OF BIRTH:  1980-06-08  DATE OF PROCEDURE:  06/08/2015 DATE OF DISCHARGE:                              OPERATIVE REPORT   PREOPERATIVE DIAGNOSES: 1. Intrauterine pregnancy, 37 and 1/7th weeks estimated gestational     age. 2. Previous cesarean section, desires repeat. 3. Desires permanent sterilization. 4. Spontaneous rupture of membranes.  POSTOPERATIVE DIAGNOSES: 1. Intrauterine pregnancy, 37 and 1/7th weeks estimated gestational     age. 2. Previous cesarean section, desires repeat. 3. Desires permanent sterilization. 4. Spontaneous rupture of membranes.  PROCEDURE:  Repeat low-transverse cesarean section and bilateral tubal ligation.  SURGEON:  Guy Sandifer. Henderson Cloud, M.D.  ANESTHESIA:  Spinal, Mal Amabile, M.D.  ESTIMATED BLOOD LOSS:  650 mL.  SPECIMENS:  Bilateral fallopian tube segments to Pathology.  FINDINGS:  Viable female infant.  Apgars, arterial cord pH, birth weight pending.  INDICATIONS AND CONSENT:  This patient is a 35 year old married white female, G2, P1, at 7 and 1/7th weeks.  She had a previous cesarean section done at approximately 32 weeks in the setting of HELLP syndrome. She presents with spontaneous rupture of membranes.  Very few contractions.  No headache.  No vision change.  No epigastric pain. Blood pressures were stable.  Repeat cesarean section discussed with the patient.  Potential risks and complications were reviewed preoperatively including, but not limited to, infection, organ damage, bleeding requiring transfusion of blood products with HIV and hepatitis acquisition, DVT, PE, and pneumonia.  I discussed tubal ligation. Permanence, failure rate, increased ectopic risk, alternatives are discussed.  The patient states she understands and agrees.   All questions were answered and consent is signed on the chart.  DESCRIPTION OF PROCEDURE:  The patient was taken to the operating room, where a spinal anesthetic was placed per Dr. Malen Gauze.  She was placed in dorsal supine position with a 15-degree left lateral wedge.  Foley catheter was placed and she was prepped and draped for [facility] protocol.  Time-out was undertaken.  After testing for adequate spinal anesthesia, skin was entered through the previous Pfannenstiel scar. Dissection was carried out in layers to the peritoneum.  Peritoneum was then extended superiorly and inferiorly.  Vesicouterine peritoneum was taken down cephalad laterally.  The bladder flap was developed and bladder blade was placed.  Uterus was incised in a low-transverse manner and the uterine cavity was entered bluntly with a hemostat.  The uterine incision was extended cephalad laterally with the fingers.  Vertex was delivered without difficulty.  A loose nuchal cord x1 was noted.  Good tone and cry was noted upon delivery.  Cord was clamped and cut.  The baby was handed to awaiting pediatrics team.  Placenta was manually delivered.  Uterine cavity is clean.  Uterus was closed in 2 running locking imbricating layers of 0 Monocryl suture which achieves good hemostasis.  Left fallopian tube is identified from cornu to fimbria. This was grasped in its mid ampullary portion with a Babcock clamp.  The knuckle of tube  was doubly ligated with 2 free ties of plain suture. The intervening knuckle was then sharply resected.  Cautery was used to assure hemostasis.  The same procedure was carried out on the right side.  Both ovaries were normal.  Irrigation reveals continued hemostasis.  Anterior peritoneum was closed in running fashion with 0 Monocryl suture which was also used to reapproximate the pyramidalis muscle in midline.  Anterior rectus fascia was closed in a running fashion with a 0 looped PDS suture.   Subcutaneous tissues were closed with interrupted plain suture and the skin was closed with a subcuticular 4-0 Vicryl on a Keith needle.  Benzoin and Steri-Strips were applied.  All counts were correct.  The patient was taken to the recovery room in stable condition.     Guy Sandifer Henderson Cloud, M.D.     JET/MEDQ  D:  06/08/2015  T:  06/08/2015  Job:  [id number]

## 2015-06-08 NOTE — H&P (Signed)
[name] is a 35 y.o. female presenting for ROM . Denies UCs, denies HA, denies vision change, denies epigastric pain. Pregnancy complicated by AMA and Hx of HELLP with pregnancy #1. Maternal Medical History:  Reason for admission: Rupture of membranes.   Contractions: Onset was 6-12 hours ago.    Fetal activity: Perceived fetal activity is normal.      OB History    Gravida Para Term Preterm AB TAB SAB Ectopic Multiple Living   2 1 0 1      1     Past Medical History  Diagnosis Date  . Hypertension   . Headache   . HELLP syndrome in third trimester   . HELLP syndrome in third trimester     at 31 weeks  . GERD (gastroesophageal reflux disease)    Past Surgical History  Procedure Laterality Date  . Cesarean section    . Wisdom tooth extraction     Family History: family history is not on file. Social History:  reports that she has never smoked. She does not have any smokeless tobacco history on file. She reports that she does not drink alcohol or use illicit drugs.   Prenatal Transfer Tool  Maternal Diabetes: No Genetic Screening: Normal Maternal Ultrasounds/Referrals: Normal Fetal Ultrasounds or other Referrals:  None Maternal Substance Abuse:  No Significant Maternal Medications:  None Significant Maternal Lab Results:  None Other Comments:  None  Review of Systems  Eyes: Negative for blurred vision.  Gastrointestinal: Negative for abdominal pain.  Neurological: Negative for headaches.      Blood pressure 111/63, pulse 82, temperature 98.2 F (36.8 C), temperature source Oral, resp. rate 16, height  (1.6 m), weight 188 lb (85.276 kg). Maternal Exam:  Uterine Assessment: Contraction strength is mild.  Contraction frequency is rare.   Abdomen: Patient reports no abdominal tenderness.   Fetal Exam Fetal State Assessment: Category I - tracings are normal.     Physical Exam  Cardiovascular: Normal rate and regular rhythm.   Respiratory: Effort  normal and breath sounds normal.  GI: Soft. There is no tenderness.  Neurological: She has normal reflexes.    Prenatal labs: ABO, Rh: --/--/A POS, A POS (02/08 0010) Antibody: NEG (02/08 0010) Rubella:   RPR:    HBsAg:    HIV:    GBS:     Assessment/Plan: 35 yo G2P1 @ 37 1/7 weeks  SROM D/W patient cesarean section with risks of infection, organ damage, bleeding/transfusion-HIV/Hep, DVT/PE, pneumonia. Also D/W BTL with permanence, failure rate and increased ectopic risk.   Zuhair Lariccia II,Marbeth Smedley E 06/08/2015, 5:44 AM

## 2015-06-08 NOTE — Lactation Note (Signed)
This note was copied from a baby's chart. Lactation Consultation Note Initial visit at 3 hours of age.  Baby latched for a few minutes after delivery, mom unsure how much baby got.  Mom is recovering from c/s and has been having emesis with movement.  Baby is asleep in the crib and mom is very drowsy after medications for emesis given.  LC assisted with instruction of hand expression with several drops collected to save for next feeding as needed.  Mom reports minimal discomfort with hand expression   FOB (1st baby) is at bedside supportive and also able to demonstrate hand expression for mom as she is drowsy.  Mom has large full breasts with right nipple slightly flat with compressible tissue.  Encouraged mom to feed on demand with early cues and allow baby STS.  Mom to call for assist with positioning help and supplementing EBM with spoon feeding as needed.  [facility] LC resources given and discussed.  Early newborn behavior discussed.  Report given to [facility] RN and may need LC follow up today. Mom to call for assist as needed.    Patient Name: [name] [id number] Date: 06/08/2015 Reason for consult: Initial assessment   Maternal Data Formula Feeding for Exclusion: No Has patient been taught Hand Expression?: Yes Does the patient have breastfeeding experience prior to this delivery?: Yes  Feeding    LATCH Score/Interventions Latch: Repeated attempts needed to sustain latch, nipple held in mouth throughout feeding, stimulation needed to elicit sucking reflex. Intervention(s): Adjust position;Assist with latch;Breast compression  Audible Swallowing: None Intervention(s): Skin to skin;Hand expression  Type of Nipple: Everted at rest and after stimulation  Comfort (Breast/Nipple): Soft / non-tender     Hold (Positioning): No assistance needed to correctly position infant at breast. Intervention(s): Breastfeeding basics reviewed;Skin to skin  LATCH Score: 7  Lactation Tools Discussed/Used      Consult Status Consult Status: Follow-up Date: 06/09/15 Follow-up type: In-patient    Sinjin Amero, Arvella Merles 06/08/2015, 10:15 AM

## 2015-06-08 NOTE — Transfer of Care (Signed)
Immediate Anesthesia Transfer of Care Note  Patient: [name]  Procedure(s) Performed: Procedure(s) with comments: CESAREAN SECTION (N/A) - Repeat edc  06/28/15 NKDA   Patient Location: PACU  Anesthesia Type:Spinal  Level of Consciousness: awake, alert  and oriented  Airway & Oxygen Therapy: Patient Spontanous Breathing  Post-op Assessment: Report given to RN and Post -op Vital signs reviewed and stable  Post vital signs: Reviewed and stable  Last Vitals:  Filed Vitals:   06/08/15 0248 06/08/15 0255  BP: 111/65 111/63  Pulse: 80 82  Temp:  36.8 C  Resp:  16    Complications: No apparent anesthesia complications

## 2015-06-08 NOTE — MAU Note (Signed)
PT IN OR - PREP FOR SPINAL- WITH DR Malen Gauze

## 2015-06-08 NOTE — Progress Notes (Signed)
Preformed orthostatic VS on patient and pt was orthostatic by BP and Pulse, but asymptomatic.  Patient wanted to ambulate to restroom and did so without any problems. Told patient we would recheck orthostatic VS prior to ambulating again.

## 2015-06-08 NOTE — Progress Notes (Signed)
On admission to [facility]; patient complaining of aching pain under her right breast going into her back and shoulder blade. Patient states she began to notice it during the C/S while they were tugging and pulling. Blood pressure consistently in 140's/80's. Patient otherwise has no other signs/symptoms besides post operative nausea and vomiting. Notified Dr. Rana Snare because of her history of HELLP with her first child. Notified MD of her labs. No new orders written at this time. Will continue to monitor closely. Earl Gala, Linda Hedges [place]

## 2015-06-08 NOTE — Anesthesia Postprocedure Evaluation (Signed)
Anesthesia Post Note  Patient: [name]  Procedure(s) Performed: Procedure(s) (LRB): CESAREAN SECTION (N/A)  Patient location during evaluation: PACU Anesthesia Type: Spinal Level of consciousness: awake Pain management: pain level controlled Vital Signs Assessment: post-procedure vital signs reviewed and stable Respiratory status: spontaneous breathing Cardiovascular status: stable Postop Assessment: no headache, no backache, spinal receding, no signs of nausea or vomiting and patient able to bend at knees Anesthetic complications: no    Last Vitals:  Filed Vitals:   06/08/15 0845 06/08/15 0905  BP: 140/89 141/82  Pulse: 79 74  Temp:  36.6 C  Resp: 15 18    Last Pain:  Filed Vitals:   06/08/15 0921  PainSc: 1                  Tereza Gilham JR,JOHN Patriece Archbold

## 2015-06-08 NOTE — Progress Notes (Signed)
Patient vomiting and has already had Zofran in PACU. Called Dr. Arby Barrette who ordered phenergan 12.5mg  IV q30 min PRN x2 doses. Will continue to monitor. [name], Linda Hedges [place]

## 2015-06-08 NOTE — Brief Op Note (Signed)
06/07/2015 - 06/08/2015  7:10 AM  PATIENT:  [name]  35 y.o. female  PRE-OPERATIVE DIAGNOSIS:  Previous cesarean section desires repeat and BTL  POST-OPERATIVE DIAGNOSIS:  Previous cesarean section desires repeat and BTL  PROCEDURE:  Procedure(s) with comments: CESAREAN SECTION (N/A) - Repeat edc  06/28/15 NKDA BTL  SURGEON:  Surgeon(s) and Role:    * Marcelle Overlie, MD - Primary    * Harold Hedge, MD  PHYSICIAN ASSISTANT:   ASSISTANTS: none   ANESTHESIA:   spinal  EBL:     BLOOD ADMINISTERED:none  DRAINS: Urinary Catheter (Foley)   LOCAL MEDICATIONS USED:  NONE  SPECIMEN:  Source of Specimen:  bilateral fallopian tube segments  DISPOSITION OF SPECIMEN:  PATHOLOGY  COUNTS:  YES  TOURNIQUET:  * No tourniquets in log *  DICTATION: .Other Dictation: Dictation Number [id number]  PLAN OF CARE: Admit to inpatient   PATIENT DISPOSITION:  PACU - hemodynamically stable.   Delay start of Pharmacological VTE agent (>24hrs) due to surgical blood loss or risk of bleeding: yes

## 2015-06-09 ENCOUNTER — Encounter (HOSPITAL_COMMUNITY): Payer: Self-pay | Admitting: Obstetrics and Gynecology

## 2015-06-09 LAB — CBC
HCT: 28.4 % — ABNORMAL LOW (ref 36.0–46.0)
HEMOGLOBIN: 9.7 g/dL — AB (ref 12.0–15.0)
MCH: 30.8 pg (ref 26.0–34.0)
MCHC: 34.2 g/dL (ref 30.0–36.0)
MCV: 90.2 fL (ref 78.0–100.0)
Platelets: 101 10*3/uL — ABNORMAL LOW (ref 150–400)
RBC: 3.15 MIL/uL — ABNORMAL LOW (ref 3.87–5.11)
RDW: 13.7 % (ref 11.5–15.5)
WBC: 13.3 10*3/uL — ABNORMAL HIGH (ref 4.0–10.5)

## 2015-06-09 LAB — BIRTH TISSUE RECOVERY COLLECTION (PLACENTA DONATION)

## 2015-06-09 MED ORDER — IBUPROFEN 600 MG PO TABS
600.0000 mg | ORAL_TABLET | Freq: Four times a day (QID) | ORAL | Status: DC | PRN
  Administered 2015-06-09 – 2015-06-10 (×2): 600 mg via ORAL
  Filled 2015-06-09 (×2): qty 1

## 2015-06-09 MED ORDER — LABETALOL HCL 100 MG PO TABS
100.0000 mg | ORAL_TABLET | Freq: Two times a day (BID) | ORAL | Status: DC
  Administered 2015-06-10: 100 mg via ORAL
  Filled 2015-06-09: qty 1

## 2015-06-09 NOTE — Lactation Note (Addendum)
This note was copied from a baby's chart. Lactation Consultation Note  Assisted mother with positioning baby at the breast.  [name] attached and held the NS in her mouth but did not suckle. A 41fr feeding tube with a 12 ml syringe of formula was applied. The tube was slid into the corner of her mouth. A few drops were pushed into her mouth and she began to suckle. She transferred a total of 17 ml with minimal assistance . Mom to post-pump. Plan is to feed [name] at least every 3 hours or sooner if she is hungry.  MOB will then pump.  Encouraged her to call for assistance with the next feeding. IBCLC noted that [facility] had a lingual frenum. Snapback was noted when she suckled on a gloved finger.  Some central retraction noted with extension.  This was not discussed with the parents.  Parents were pleased with the outcome of the consultation.  Patient Name: [name] RUEAV'W Date: 06/09/2015 Reason for consult: Follow-up assessment   Maternal Data Has patient been taught Hand Expression?: Yes  Feeding Feeding Type: Breast Milk with Formula added  LATCH Score/Interventions Latch: Grasps breast easily, tongue down, lips flanged, rhythmical sucking.  Audible Swallowing: Spontaneous and intermittent  Type of Nipple: Flat (nipple shield)  Comfort (Breast/Nipple): Soft / non-tender     Hold (Positioning): Assistance needed to correctly position infant at breast and maintain latch.  LATCH Score: 8  Lactation Tools Discussed/Used Tools: Nipple Shields Nipple shield size: 24   Consult Status Consult Status: Follow-up Date: 06/10/15 Follow-up type: In-patient    Soyla Dryer 06/09/2015, 4:41 PM

## 2015-06-09 NOTE — Lactation Note (Signed)
This note was copied from a baby's chart. Lactation Consultation Note  Family called for assistance with SNS.  Upon entering baby was latched on L side w/ #24 NS. FOB had prefilled NS with cololstrum.  Mother has recently pumped approx 6ml of colostrum. Repeated prefilling NS until gone.  Baby needed stimulation to suck in a pattern. Demonstrated how to place SNS under #24NS and tape. Spontaneous sucks and swallows observed. Encouraged mother to post pump after feedings, at least every 3 hours with the exception of 1-2 feedings for rest. Provided mother with another #24NS and volume guidelines. Praised parents for their efforts and told them to call if further assistance is needed.  Patient Name: [name] [id number] Date: 06/09/2015 Reason for consult: Follow-up assessment   Maternal Data Has patient been taught Hand Expression?: Yes  Feeding Feeding Type: Breast Fed  LATCH Score/Interventions Latch: Grasps breast easily, tongue down, lips flanged, rhythmical sucking.  Audible Swallowing: A few with stimulation  Type of Nipple: Flat  Comfort (Breast/Nipple): Soft / non-tender     Hold (Positioning): No assistance needed to correctly position infant at breast.  LATCH Score: 8  Lactation Tools Discussed/Used Tools: Nipple [name];Supplemental Nutrition System (curved tip syringe) Nipple shield size: 24   Consult Status Consult Status: Follow-up Date: 06/10/15 Follow-up type: In-patient    Dahlia Byes [facility] 06/09/2015, 7:37 PM

## 2015-06-09 NOTE — Progress Notes (Signed)
Subjective: Postpartum Day 1: Cesarean Delivery Patient reports tolerating PO and no problems voiding.  Denies HA, blurred vision or RUQ pain  Objective: Vital signs in last 24 hours: Temp:  [97.6 F (36.4 C)-98.4 F (36.9 C)] 98.4 F (36.9 C) (02/09 0415) Pulse Rate:  [68-98] 75 (02/09 0415) Resp:  [14-18] 18 (02/09 0415) BP: (95-144)/(53-96) 108/63 mmHg (02/09 0415) SpO2:  [97 %-100 %] 97 % (02/09 0415)  Physical Exam:  General: alert and cooperative Lochia: appropriate Uterine Fundus: firm Incision: abd pressure dressing removed, honeycomb dressing saturated. Steristrips and honeycomb dressing replaced. No active bleeding observed  DVT Evaluation: No evidence of DVT seen on physical exam. Negative Homan's sign. No cords or calf tenderness. Calf/Ankle edema is present.   Recent Labs  06/08/15 0600 06/09/15 0505  HGB 10.5* 9.7*  HCT 31.2* 28.4*    Assessment/Plan: Status post Cesarean section. Doing well postoperatively.  Continue current care.  New parameters added for Labetalol.- hold labetalol if diastolic BP is <70  Khila Papp G 06/09/2015, 7:58 AM

## 2015-06-09 NOTE — Anesthesia Postprocedure Evaluation (Signed)
Anesthesia Post Note  Patient: [name]  Procedure(s) Performed: Procedure(s) (LRB): CESAREAN SECTION (N/A)  Patient location during evaluation: Mother Baby Anesthesia Type: Epidural Level of consciousness: awake and alert Pain management: pain level controlled Vital Signs Assessment: post-procedure vital signs reviewed and stable Respiratory status: spontaneous breathing Cardiovascular status: stable Postop Assessment: no signs of nausea or vomiting and adequate PO intake Anesthetic complications: no    Last Vitals:  Filed Vitals:   06/09/15 0213 06/09/15 0415  BP: 99/56 108/63  Pulse:  75  Temp:  36.9 C  Resp:  18    Last Pain:  Filed Vitals:   06/09/15 0612  PainSc: Asleep                 Takiera Mayo [place]

## 2015-06-09 NOTE — Clinical Social Work Maternal (Signed)
CLINICAL SOCIAL WORK MATERNAL/CHILD NOTE  Patient Details  Name: [name] MRN: [id number] Date of Birth: 1980-05-27  Date:  06/09/2015  Clinical Social Worker Initiating Note:  Loleta Books MSW, LCSW Date/ Time Initiated:  06/09/15/1020     Child's Name:  Gara Kroner   Legal Guardian:  Alvino Chapel and [place] Gotwalt  Need for Interpreter:  None   Date of Referral:  06/08/15     Reason for Referral: History of postpartum depression  Referral Source:  [facility]   Address:  [address] [place], [place] [address]   Phone number:  [phone]   Household Members:  Minor Children, Spouse   Natural Supports (not living in the home):  Extended Family, Children   Professional Supports: None   Employment: Full-time   Education:    N/A  Surveyor, quantity Resources:  Media planner   Other Resources:    None identified  Cultural/Religious Considerations Which May Impact Care:  None reported  Strengths:  Ability to meet basic needs , Home prepared for child , Pediatrician chosen    Risk Factors/Current Problems:   1. Mental Health Concerns -- MOB presents with a history of depression and postpartum depression.   Cognitive State:  Able to Concentrate , Alert , Goal Oriented , Linear Thinking    Mood/Affect:  Happy , Anxious    CSW Assessment: CSW received request for consult due to MOB presenting with a history of postpartum depression.  FOB and MOB's 36 year old son were also present in the room. The infant was crying during the assessment as the FOB changed it's diaper, which limited engagement as there were numerous distractions in the room.  MOB displayed a full range in affect, and presented in a pleasant mood.   MOB denied questions, concerns, or needs as she transitions postpartum. She endorsed feelings of happiness secondary to the infant's birth, and reported feeling well supported by the FOB and their immediate family.  MOB stated that she feels like a first time  mother since it has been almost 11 years since her son was born.  MOB reflected upon the numerous changes in this experience in comparison to her son's birth. Per MOB, her son was born at 85 weeks, and she was in the ICU for 3 days. She discussed how these events, and the subsequent 1 month NICU admission led to her feeling emotional, tearful, and overwhelmed.  MOB stated that it has been a much better experience with this pregnancy and childbirth experience; however, she reported that she is concerned since she needs to return to work in 8 weeks. Per MOB, she stayed at home for 3 years with her son, and stated that she has felt anxious as she anticipates a return to work postpartum.  MOB reported that she has found a member of her church community to watch the infant, as she is grateful that the infant will not be a daycare setting; however, she is concerned about what she will miss and how it may impact her relationship with her daughter. MOB shared that since she does not talk to others about how she is feeling, she has not had an opportunity to receive feedback from any other mothers who may be in a similar situation, or may have a strong relationship with their children despite working outside of the home.  MOB stated that the FOB has been helpful and supportive of her when she begins to feel more anxious, and recognized the need to utilize  positive self-talk when she feels overwhelmed and to remind herself that she will be "okay".   Per MOB, she believes that her prior experience of postpartum depression was closely linked to being a first time mother and experiencing a NICU admission.  She stated that she cannot recall the length of symptoms, or the exact symptoms she experienced.  MOB stated that she has "always" had depressive symptoms secondary to a prior unhealthy relationship; however, denied any prior history of medication management or therapy. She stated that she feels well supportive by the FOB,  and discussed how it is the "healthiest" relationship.  MOB stated that despite anxiety during this pregnancy, predominant mood continued to be that of happiness. She shared that she has been looking forward to the infant's birth, and can utilize these feelings when she feels anxious about returning to work.    MOB presented as receptive to CSW education on perinatal mood and anxiety disorders. She acknowledged her increased risk factors, and agreed to follow up with her medical provider if she notes onset of symptoms.   MOB expressed appreciation for the visit, denied questions or concerns, and agreed to contact CSW if additional needs arise.  CSW Plan/Description:   1. Patient/Family Education-- Perinatal mood and anxiety disorders 2. No Further Intervention Required/No Barriers to Discharge    Kelby Fam 06/09/2015, 10:51 AM

## 2015-06-09 NOTE — Addendum Note (Signed)
Addendum  created 06/09/15 0750 by Elgie Congo, CRNA   Modules edited: Clinical Notes   Clinical Notes:  File: [id number]

## 2015-06-10 MED ORDER — OXYCODONE-ACETAMINOPHEN 5-325 MG PO TABS: 1.0000 | ORAL_TABLET | ORAL | Status: AC | PRN

## 2015-06-10 MED ORDER — MEASLES, MUMPS & RUBELLA VAC ~~LOC~~ INJ
0.5000 mL | INJECTION | Freq: Once | SUBCUTANEOUS | Status: AC
Start: 2015-06-10 — End: 2015-06-10
  Administered 2015-06-10: 0.5 mL via SUBCUTANEOUS
  Filled 2015-06-10: qty 0.5

## 2015-06-10 MED ORDER — IBUPROFEN 600 MG PO TABS: 600.0000 mg | ORAL_TABLET | Freq: Four times a day (QID) | ORAL | Status: AC | PRN

## 2015-06-10 NOTE — Discharge Summary (Signed)
Obstetric Discharge Summary Reason for Admission: rupture of membranes Prenatal Procedures: ultrasound Intrapartum Procedures: cesarean: low cervical, transverse Postpartum Procedures: none Complications-Operative and Postpartum: none HEMOGLOBIN  Date Value Ref Range Status  06/09/2015 9.7* 12.0 - 15.0 g/dL Final   HCT  Date Value Ref Range Status  06/09/2015 28.4* 36.0 - 46.0 % Final    Physical Exam:  General: alert and cooperative Lochia: appropriate Uterine Fundus: firm Incision: healing well DVT Evaluation: No evidence of DVT seen on physical exam. Negative Homan's sign. No cords or calf tenderness. No significant calf/ankle edema.  Discharge Diagnoses: Term Pregnancy-delivered  Discharge Information: Date: 06/10/2015 Activity: pelvic rest Diet: routine Medications: PNV, Ibuprofen, Percocet and labetalol Condition: stable Instructions: refer to practice specific booklet Discharge to: home   Newborn Data: Live born female  Birth Weight: 6 lb 13.5 oz (3105 g) APGAR: 8, 9  Home with mother.  Shabana Armentrout G 06/10/2015, 8:03 AM

## 2015-06-21 ENCOUNTER — Other Ambulatory Visit (HOSPITAL_COMMUNITY): Payer: Self-pay

## 2015-06-22 ENCOUNTER — Inpatient Hospital Stay (HOSPITAL_COMMUNITY): Admission: AD | Admit: 2015-06-22 | Payer: Self-pay | Source: Ambulatory Visit | Admitting: Obstetrics and Gynecology

## 2018-12-02 ENCOUNTER — Other Ambulatory Visit: Payer: Self-pay | Admitting: Obstetrics and Gynecology

## 2018-12-02 DIAGNOSIS — R928 Other abnormal and inconclusive findings on diagnostic imaging of breast: Secondary | ICD-10-CM

## 2018-12-04 ENCOUNTER — Other Ambulatory Visit: Payer: Self-pay

## 2018-12-04 ENCOUNTER — Ambulatory Visit
Admission: RE | Admit: 2018-12-04 | Discharge: 2018-12-04 | Disposition: A | Discharge status: Home / Self Care | Payer: BLUE CROSS/BLUE SHIELD | Source: Ambulatory Visit | Attending: Obstetrics and Gynecology | Admitting: Obstetrics and Gynecology

## 2018-12-04 ENCOUNTER — Ambulatory Visit: Payer: BLUE CROSS/BLUE SHIELD

## 2018-12-04 DIAGNOSIS — R928 Other abnormal and inconclusive findings on diagnostic imaging of breast: Secondary | ICD-10-CM

## 2020-03-28 ENCOUNTER — Other Ambulatory Visit: Payer: Self-pay | Admitting: Obstetrics and Gynecology

## 2020-09-23 IMAGING — MG DIGITAL DIAGNOSTIC UNILATERAL RIGHT MAMMOGRAM WITH TOMO AND CAD
6 series · 6 of 18 positions shown · non-contrast
Comparison: Screening exam dated 11/28/2018

CLINICAL DATA: Patient recalled for right breast asymmetry

EXAM:
DIGITAL DIAGNOSTIC UNILATERAL RIGHT MAMMOGRAM WITH CAD AND TOMO

[R CC synth-2D (1 of 2)]
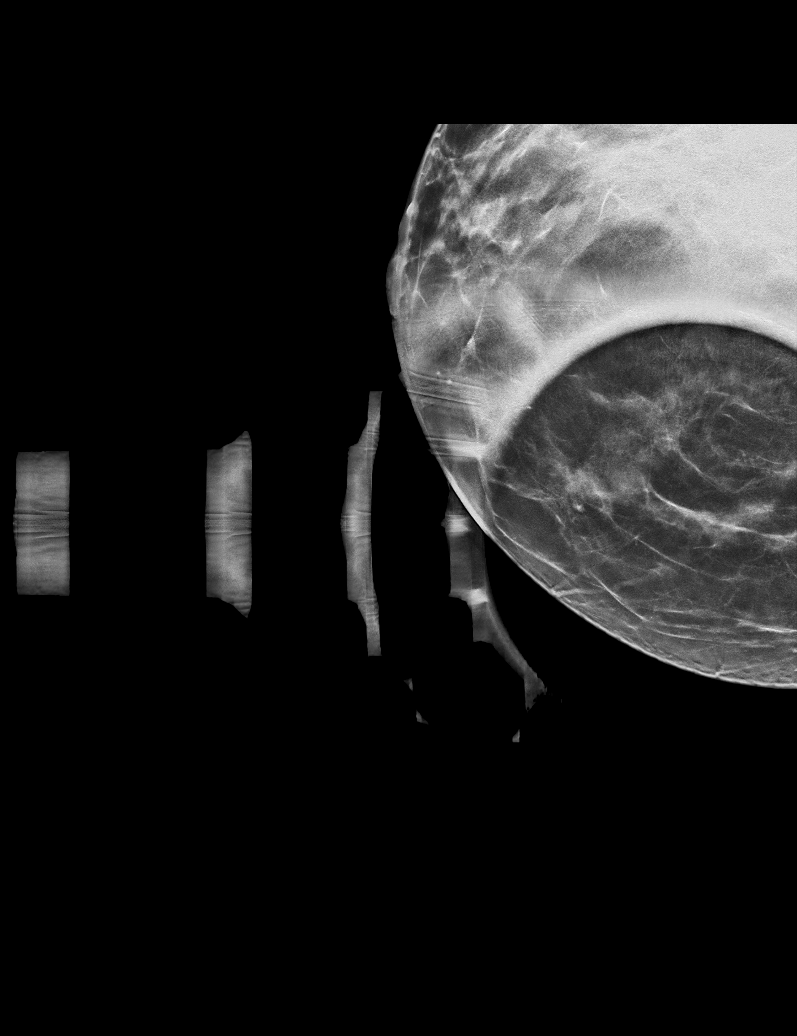

[R MLO synth-2D]
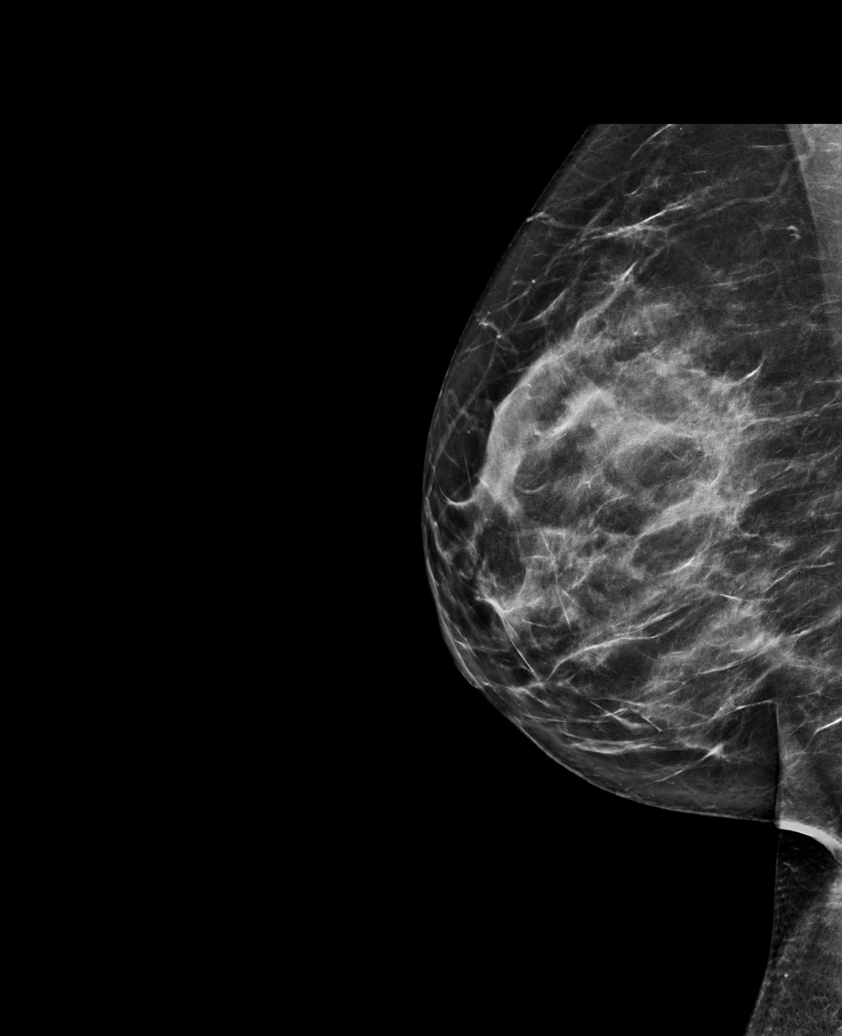

[R CC synth-2D (2 of 2)]
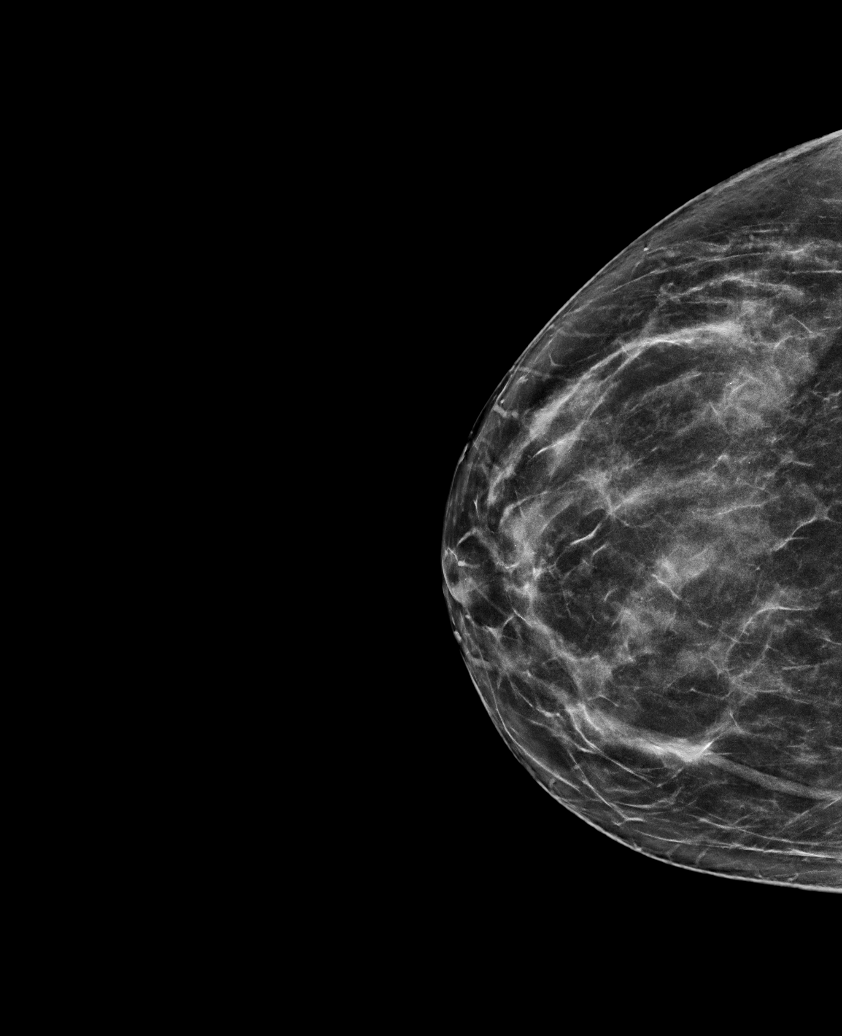

[R MLO tomo · tomo slice 37/72.0]
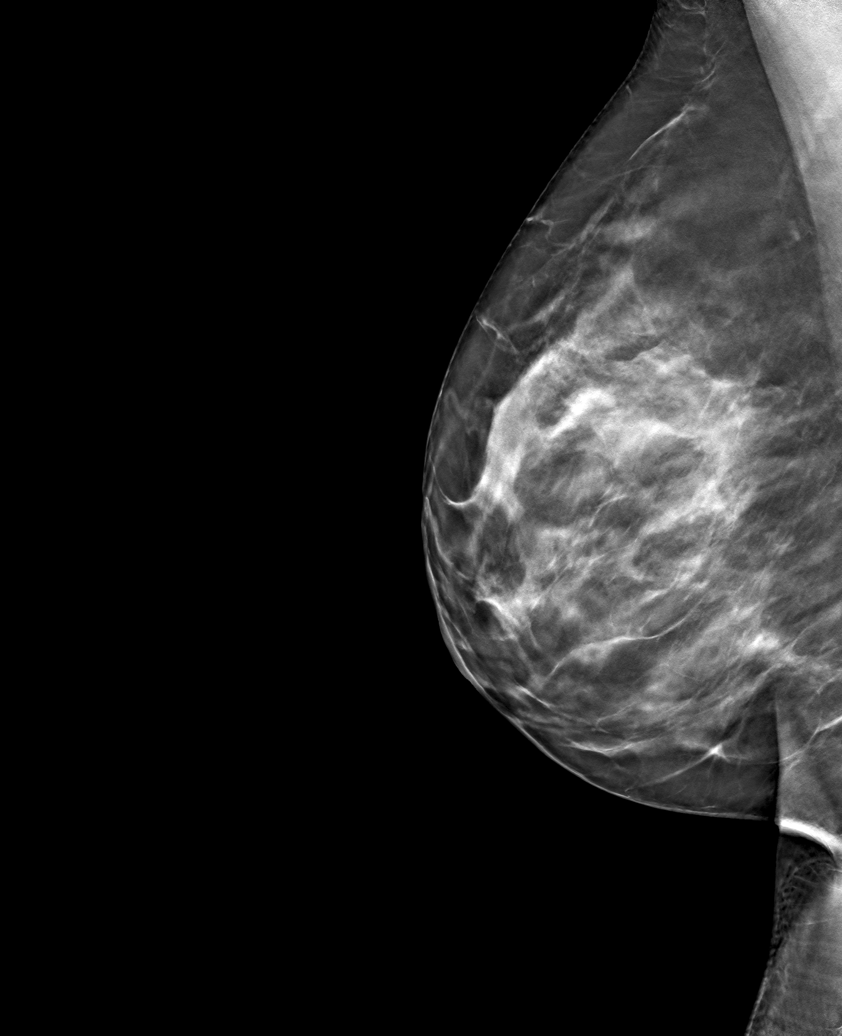

[R CC tomo (1 of 2) · tomo slice 36/71.0]
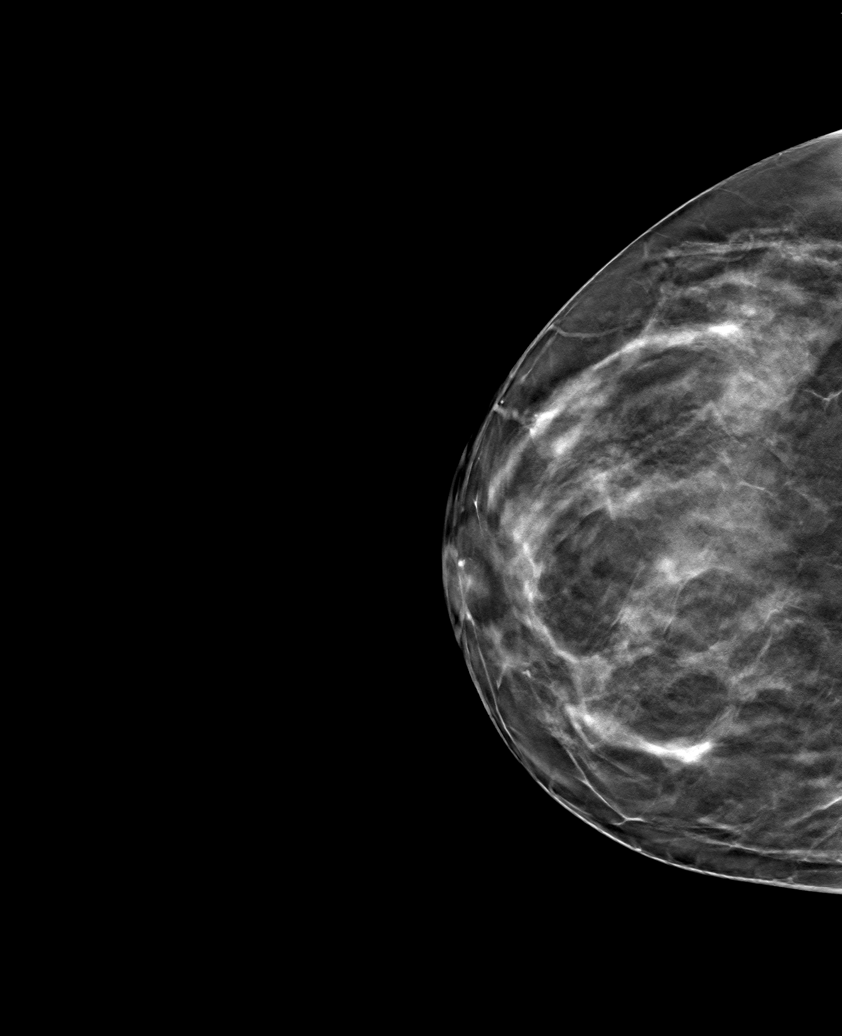

[R CC tomo (2 of 2) · tomo slice 27/54.0]
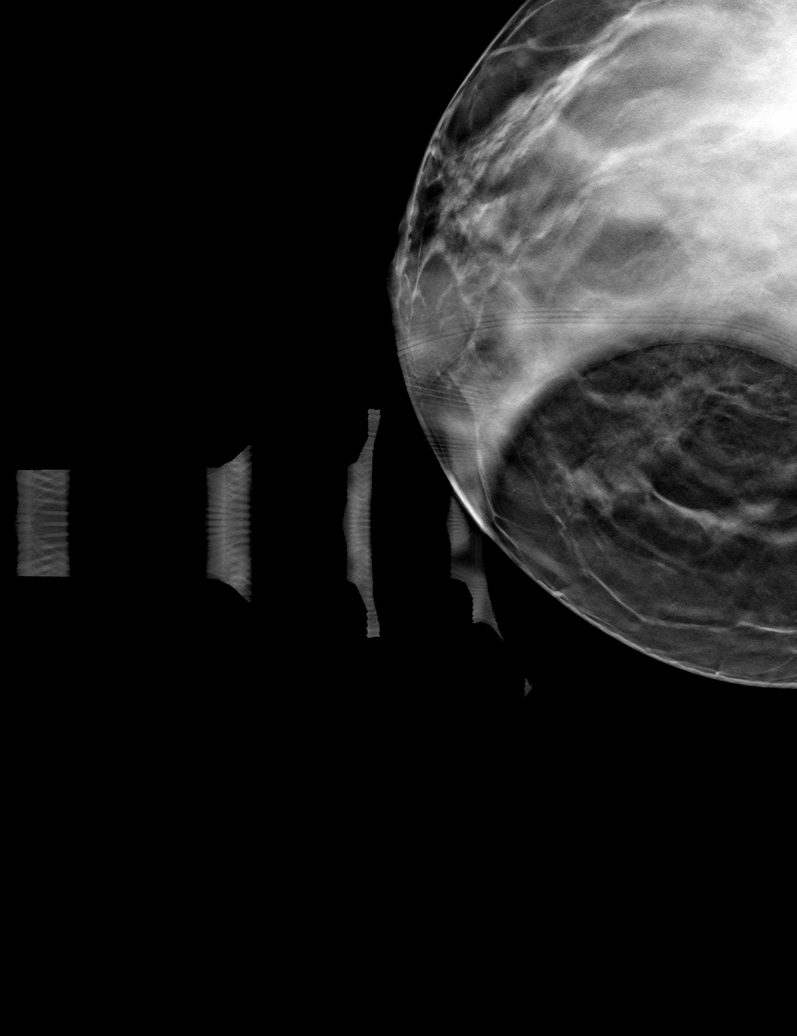

[6 of 18 positions shown; findings below may reference images not displayed]

ACR Breast Density Category c: The breast tissue is heterogeneously
dense, which may obscure small masses.
FINDINGS: Spot magnification views were performed for the possible asymmetry
in the medial right breast seen on CC view only. With additional
imaging the questioned abnormality is less conspicuous and
consistent with overlapping fibroglandular tissue.
IMPRESSION: Decreased conspicuity of the questioned abnormality in the medial
right breast with additional imaging, consistent with normal
overlapping fibroglandular tissue.

RECOMMENDATION:
Screening mammogram in one year.(Code:WB-[id number])

I have discussed the findings and recommendations with the patient.
Results were also provided in writing at the conclusion of the
visit. If applicable, a reminder letter will be sent to the patient
regarding the next appointment.

BI-RADS CATEGORY  1: Negative.
